# Patient Record
Sex: Female | Born: 1955 | Race: White | Marital: Married | Smoking: Never smoker
Health system: Southern US, Community
[De-identification: ages and names within clinical notes are randomized; demographics above are authoritative.]

## PROBLEM LIST (undated history)

## (undated) DIAGNOSIS — F329 Major depressive disorder, single episode, unspecified: Secondary | ICD-10-CM

## (undated) DIAGNOSIS — K859 Acute pancreatitis without necrosis or infection, unspecified: Secondary | ICD-10-CM

## (undated) DIAGNOSIS — K219 Gastro-esophageal reflux disease without esophagitis: Secondary | ICD-10-CM

## (undated) DIAGNOSIS — F32A Depression, unspecified: Secondary | ICD-10-CM

## (undated) HISTORY — DX: Major depressive disorder, single episode, unspecified: F32.9

## (undated) HISTORY — DX: Gastro-esophageal reflux disease without esophagitis: K21.9

## (undated) HISTORY — DX: Depression, unspecified: F32.A

## (undated) HISTORY — DX: Acute pancreatitis without necrosis or infection, unspecified: K85.90

---

## 1989-05-22 HISTORY — PX: ABDOMINAL HYSTERECTOMY: SHX81

## 1990-05-22 HISTORY — PX: BLADDER SUSPENSION: SHX72

## 1994-05-22 HISTORY — PX: HERNIA REPAIR: SHX51

## 2008-05-22 HISTORY — PX: CHOLECYSTECTOMY: SHX55

## 2011-02-07 ENCOUNTER — Ambulatory Visit: Payer: Self-pay | Admitting: Gynecology

## 2011-02-21 ENCOUNTER — Ambulatory Visit: Payer: Self-pay | Admitting: Gynecology

## 2011-03-08 ENCOUNTER — Ambulatory Visit: Payer: Self-pay | Admitting: Gynecology

## 2011-03-31 ENCOUNTER — Encounter: Payer: Self-pay | Admitting: Anesthesiology

## 2011-03-31 ENCOUNTER — Ambulatory Visit (INDEPENDENT_AMBULATORY_CARE_PROVIDER_SITE_OTHER): Payer: PRIVATE HEALTH INSURANCE | Admitting: Gynecology

## 2011-03-31 ENCOUNTER — Other Ambulatory Visit (HOSPITAL_COMMUNITY)
Admission: RE | Admit: 2011-03-31 | Discharge: 2011-03-31 | Disposition: A | Discharge status: Home / Self Care | Payer: PRIVATE HEALTH INSURANCE | Source: Ambulatory Visit | Attending: Gynecology | Admitting: Gynecology

## 2011-03-31 ENCOUNTER — Ambulatory Visit: Payer: Self-pay | Admitting: Gynecology

## 2011-03-31 VITALS — BP 120/70 | Ht <= 58 in | Wt 107.0 lb

## 2011-03-31 DIAGNOSIS — Z01419 Encounter for gynecological examination (general) (routine) without abnormal findings: Secondary | ICD-10-CM | POA: Insufficient documentation

## 2011-03-31 DIAGNOSIS — Z8719 Personal history of other diseases of the digestive system: Secondary | ICD-10-CM

## 2011-03-31 DIAGNOSIS — N951 Menopausal and female climacteric states: Secondary | ICD-10-CM

## 2011-03-31 DIAGNOSIS — IMO0002 Reserved for concepts with insufficient information to code with codable children: Secondary | ICD-10-CM

## 2011-03-31 DIAGNOSIS — K219 Gastro-esophageal reflux disease without esophagitis: Secondary | ICD-10-CM | POA: Insufficient documentation

## 2011-03-31 DIAGNOSIS — N930 Postcoital and contact bleeding: Secondary | ICD-10-CM

## 2011-03-31 DIAGNOSIS — Z1211 Encounter for screening for malignant neoplasm of colon: Secondary | ICD-10-CM

## 2011-03-31 DIAGNOSIS — N952 Postmenopausal atrophic vaginitis: Secondary | ICD-10-CM

## 2011-03-31 MED ORDER — ESTRADIOL 0.1 MG/GM VA CREA: 2.0000 g | TOPICAL_CREAM | VAGINAL | Status: AC

## 2011-03-31 NOTE — Progress Notes (Signed)
Courtney Dillon [birth year] [id number]   History:    55 y.o.  for annual exam and a new patient to the practice. Patient stated that she suffers from dyspareunia as a result of her vaginal dryness and at one point had some vaginal spotting after intercourse. At one point after intercourse and she went to the bathroom she noted some blood in the toilet water. Her urinalysis today was essentially unremarkable. Patient with prior history transvaginal hysterectomy for uterine prolapse in her late 30s and a few years later she had a retropubic sling for stress urinary incontinence. She stated she's been on oral estrogen for about 17 years. She is currently on para 0.3 mg daily. Her last gynecological examination was 6 years ago. Dr.Keung Nedra Hai is her primary physician [place] and she had her lab drawn in his office last month. Patient with past history of pancreatitis had been followed by the gastroenterologist and [facility] and her last colonoscopy was in 2010. She is overdue for mammogram. Patient never had a baseline bone density study.  Past medical history,surgical history, family history and social history were all reviewed and documented in the EPIC chart.  Gynecologic History Patient's last menstrual period was 03/30/1990. Contraception: none Last Pap: 6 years ago. Results were: normal Last mammogram: Over year ago. Results were: normal  Obstetric History OB History    Grav Para Term Preterm Abortions TAB SAB Ect Mult Living   8 8 8       8      # Outc Date GA Lbr Len/2nd Wgt Sex Del Anes PTL Lv   1 TRM     F CS   Yes   2 TRM     F CS  No Yes   3 TRM     F SVD   Yes   4 TRM     F SVD   Yes   5 TRM     F SVD   Yes   6 TRM     F SVD   Yes   7 TRM     F SVD  No Yes   8 TRM     M SVD  No Yes       ROS:  Was performed and pertinent positives and negatives are included in the history.  Exam: chaperone present  BP 120/70  Ht 4' 9.25" (1.454 m)  Wt 107 lb (48.535 kg)  BMI 22.95  kg/m2  LMP 03/30/1990  Body mass index is 22.95 kg/(m^2).  General appearance : Well developed well nourished female. No acute distress HEENT: Neck supple, trachea midline, no carotid bruits, no thyroidmegaly Lungs: Clear to auscultation, no rhonchi or wheezes, or rib retractions  Heart: Regular rate and rhythm, no murmurs or gallops Breast:Examined in sitting and supine position were symmetrical in appearance, no palpable masses or tenderness,  no skin retraction, no nipple inversion, no nipple discharge, no skin discoloration, no axillary or supraclavicular lymphadenopathy Abdomen: no palpable masses or tenderness, no rebound or guarding Extremities: no edema or skin discoloration or tenderness  Pelvic:  Bartholin, Urethra, Skene Glands: Within normal limits             Vagina: No gross lesions or discharge  Cervix: Absent  Uterus  absent, Adnexa  Without masses or tenderness  Anus and perineum  normal   Rectovaginal  normal sphincter tone without palpated masses or tenderness             Hemoccult done resulting in some  this dictation     Assessment/Plan:  55 y.o. female for annual exam with evident vaginal atrophy which may been contributing to her dyspareunia and spotting. At time of her Pap smear and was noted that her vagina was very dry and friable which was explained her spotting she's had. No gross lesions were seen. Vaginal cuff intact. Pap smear was done. Lower fecal occult blood testing. She'll be scheduled for a baseline bone density study here in our office the next 2 weeks. Requisition was given her to schedule her mammogram and [facility]. She will discontinue the Premarin 0.3 mg. Start Estrace vaginal cream each bedtime for one week then twice a week thereafter. We went to a lengthy discussion of risks benefits and pros and cons of hormone replacement therapy as well as the WHI study. All these issues were discussed with the patient in Spanish. She was instructed  to take calcium and vitamin D for osteoporosis prevention as well as weight bearing exercise 30-45 minutes 3 or 4 times a week. All the above was discussed with the patient Spanish and we'll follow accordingly.    Ok Edwards MD, 3:58 PM 03/31/2011

## 2011-11-09 ENCOUNTER — Ambulatory Visit: Payer: PRIVATE HEALTH INSURANCE | Admitting: Women's Health

## 2013-01-17 ENCOUNTER — Encounter: Payer: Self-pay | Admitting: Gynecology

## 2013-01-17 ENCOUNTER — Ambulatory Visit (INDEPENDENT_AMBULATORY_CARE_PROVIDER_SITE_OTHER): Payer: Commercial Managed Care - PPO | Admitting: Gynecology

## 2013-01-17 VITALS — BP 120/74

## 2013-01-17 DIAGNOSIS — Z01419 Encounter for gynecological examination (general) (routine) without abnormal findings: Secondary | ICD-10-CM

## 2013-01-17 DIAGNOSIS — Z23 Encounter for immunization: Secondary | ICD-10-CM

## 2013-01-17 DIAGNOSIS — Z79890 Hormone replacement therapy (postmenopausal): Secondary | ICD-10-CM | POA: Insufficient documentation

## 2013-01-17 DIAGNOSIS — M810 Age-related osteoporosis without current pathological fracture: Secondary | ICD-10-CM | POA: Insufficient documentation

## 2013-01-17 DIAGNOSIS — N952 Postmenopausal atrophic vaginitis: Secondary | ICD-10-CM | POA: Insufficient documentation

## 2013-01-17 MED ORDER — NONFORMULARY OR COMPOUNDED ITEM: Status: DC

## 2013-01-17 NOTE — Progress Notes (Signed)
Courtney Dillon [birth year] [id number]   History:    57 y.o.  for annual gyn exam who has not been seen in the office in 2 years. When she was seen in the office in 2012 she was complaining of vaginal dryness and dyspareunia and vaginal spotting after intercourse. She had been on oral estrogen consisting of 0.3 mg daily for about 17 years when I saw her for the first time in 2012. I had switched her to Premarin vaginal cream twice a week for vaginal atrophy and she has not been taking it regularly only on a when necessary basis because of cost. Dr.Keung Nedra Hai is her primary physician [place] who has been doing her lab work. She has informed me that this year as a result of a recent bone density study she was started on Fosamax 70 mg q. Weekly for what sounds to me like osteoporosis.Patient with past history of pancreatitis had been followed by the gastroenterologist and [facility] and her last colonoscopy was in 2010. Patient has informed me that her last mammogram was in 2012 at [facility] and was normal we do not have the report. She states she freely does report so present examination. She is not received the Tdap vaccine.    Past medical history,surgical history, family history and social history were all reviewed and documented in the EPIC chart.  Gynecologic History Patient's last menstrual period was 03/30/1990. Contraception: status post hysterectomy Last Pap: 2012. Results were: normal Last mammogram: 2012. Results were: normal  Obstetric History OB History  Gravida Para Term Preterm AB SAB TAB Ectopic Multiple Living  8 8 8       8     # Outcome Date GA Lbr Len/2nd Weight Sex Delivery Anes PTL Lv  8 TRM     M SVD  N Y  7 TRM     F SVD  N Y  6 TRM     F SVD   Y  5 TRM     F SVD   Y  4 TRM     F SVD   Y  3 TRM     F SVD   Y  2 TRM     F CS  N Y  1 TRM     F CS   Y       ROS: A ROS was performed and pertinent positives and negatives are included in the history.   GENERAL: No fevers or chills. HEENT: No change in vision, no earache, sore throat or sinus congestion. NECK: No pain or stiffness. CARDIOVASCULAR: No chest pain or pressure. No palpitations. PULMONARY: No shortness of breath, cough or wheeze. GASTROINTESTINAL: No abdominal pain, nausea, vomiting or diarrhea, melena or bright red blood per rectum. GENITOURINARY: No urinary frequency, urgency, hesitancy or dysuria. MUSCULOSKELETAL: No joint or muscle pain, no back pain, no recent trauma. DERMATOLOGIC: No rash, no itching, no lesions. ENDOCRINE: No polyuria, polydipsia, no heat or cold intolerance. No recent change in weight. HEMATOLOGICAL: No anemia or easy bruising or bleeding. NEUROLOGIC: No headache, seizures, numbness, tingling or weakness. PSYCHIATRIC: No depression, no loss of interest in normal activity or change in sleep pattern.     Exam: chaperone present  BP 120/74  LMP 03/30/1990  There is no weight on file to calculate BMI.  General appearance : Well developed well nourished female. No acute distress HEENT: Neck supple, trachea midline, no carotid bruits, no thyroidmegaly Lungs: Clear to auscultation, no rhonchi or wheezes, or rib  retractions  Heart: Regular rate and rhythm, no murmurs or gallops Breast:Examined in sitting and supine position were symmetrical in appearance, no palpable masses or tenderness,  no skin retraction, no nipple inversion, no nipple discharge, no skin discoloration, no axillary or supraclavicular lymphadenopathy Abdomen: no palpable masses or tenderness, no rebound or guarding Extremities: no edema or skin discoloration or tenderness  Pelvic:  Bartholin, Urethra, Skene Glands: Within normal limits             Vagina: No gross lesions or discharge  Cervix: absent  Uterus Absent  Adnexa  Without masses or tenderness  Anus and perineum  normal   Rectovaginal  normal sphincter tone without palpated masses or tenderness             Hemoccult Card provided      Assessment/Plan:  57 y.o. female for annual exam who is symptomatic as to her vaginal atrophy. No vaginal bleeding reported. We once again discussed women's health initiative study. She will be placed on the compounded estrogen such as estradiol 0.02% to apply intravaginally twice a week. I have requested that she obtain copy of her bone density study for me to review as well as her last mammogram. I have given her a requisition for her to schedule her mammogram at [facility] next few weeks. We discussed the importance of calcium and vitamin D with regular exercise. She was reminded to submit to the office for Hemoccult cards for testing. Patient received the Tdap vaccine today.    Ok Edwards MD, 5:35 PM 01/17/2013

## 2013-01-17 NOTE — Patient Instructions (Addendum)
Vacuna difteria, ttanos, tos ferina (DTP) - Lo que debe saber  (Tetanus, Diphtheria, Pertussis [Tdap] Vaccine, What You Need to Know) PORQU VACUNARSE?  El ttanos, la difteria y la tos [place] pueden ser enfermedades muy graves, an en adolescentes y [place]. La vacuna Tdap nos puede proteger de estas enfermedades.  El TTANOS (Trismo) provoca la contraccin dolorosa de los msculos, por lo general, en todo el cuerpo.   Puede causar el endurecimiento de los msculos de la cabeza y el cuello, de modo que impide abrir la boca, tragar y en algunos casos, Industrial/product designer. El ttanos causa la muerte de 1 de cada 5 personas que se infectan. La DIFTERIA produce la formacin de una membrana gruesa que cubre el fondo de la garganta.   Puede causar problemas respiratorios, parlisis, insuficiencia cardaca e incluso la muerte. TOS FERINA (Pertusis) causa episodios de tos graves, que pueden hacer difcil la respiracin, causar vmitos y trastornos del sueo.   Tambin puede ser la causa de prdida de [place], incontinencia y Surveyor, minerals de Forensic psychologist. Dos de cada 100 adolescentes y Orthoptist de cada 100 adultos que enferman de pertusis deben ser hospitalizados, tienen complicaciones como la neumona o [place]. Estas enfermedades son provocadas por bacterias. La difteria y el pertusis se contagian de persona a persona a travs de la tos o el estornudo. El ttanos ingresa al organismo a travs de cortes, rasguos o heridas.  Antes de las vacunas, en los [place] se vieron ms de 200.000 casos al ao de difteria y tos [place] y cientos de casos de ttanos. Desde el inicio de la vacunacin, los casos de ttanos y difteria han disminuido alrededor del 99% y los casos de tos ferina alrededor del 80%.  Tdap  La vacuna Tdap protege a adolescentes y adultos contra el ttanos, la difteria y la tos [place]. Una dosis de Tdap se administra a los 11 o 12 aos de edad. Las [employer] and [employer] no recibieron la vacuna Tdap a esa edad deben  recibirla tan pronto como sea posible.  Es muy importante que los profesionales de la salud y todos aquellos que tengan contacto cercano con bebs menores de 12 meses reciban la Tdap.  Las mujeres embarazadas deben recibir una dosis de Tdap en cada Psychiatrist, para proteger al recin nacido de la tos [place]. Los nios tienen mayor riesgo de complicaciones graves y potencialmente mortales debido a la tos [place].  Una vacuna similar, llamada Td, protege contra el ttanos y la difteria, pero no contra la tos [place]. Cada 10 aos debe recibirse un refuerzo de Td. La Tdap se puede administrar como uno de estos refuerzos, si todava no ha recibido una dosis. Tambin se puede aplicar despus de un corte o quemadura grave para prevenir la infeccin por ttanos.  El mdico le dar ms informacin.   Osteoporosis (Osteoporosis)  A lo largo de la vida, el cuerpo elimina el tejido viejo de los [place] y lo reemplaza por tejido nuevo. A medida que se envejece, el cuerpo no puede reponerlo tan rpidamente como lo elimina. Alrededor Safeco Corporation 30 aos, la mayora de las personas comienza a perder poco a poco el tejido de los huesos debido al desequilibrio entre la prdida y la reposicin. Algunas personas pierden ms tejido Fisher [employer]. La prdida del tejido del hueso ms all de un grado normal se considera osteoporosis.  La osteoporosis afecta la resistencia y la durabilidad de los [place]. El interior de los extremos de los huesos y los huesos planos, Fiserv  de la pelvis, se parecen a un panal porque estn llenos de pequeos espacios abiertos. Al perder tejido los huesos se vuelven menos densos. Esto significa que los espacios abiertos se hacen ms grandes y las paredes entre estos espacios se vuelven ms delgadas. Como consecuencia, los huesos se vuelven ms dbiles. Los huesos de una persona que sufre osteoporosis llegan a ser tan dbiles que pueden romperse (fractura) en un accidente leve, como una simple cada.   CAUSAS  Los siguientes factores han sido asociados con el desarrollo de la osteoporosis.   El hbito de fumar.  Beber ms de 2 medidas de bebidas [employer]el[employer] Corporation a la [place].  Uso de ciertos medicamentos durante un tiempo prolongado.  Corticoides.  Medicamentos para la quimioterapia.  Medicamentos para la tiroides.  Medicamentos antiepilpticos.  Medicamentos de supresin gonadal.  Medicamentos inmunosupresores.  Tener bajo peso.  Falta de actividad fsica.  Falta de exposicin al sol. Esto puede ser la causa del dficit de vitamina D.  Ciertas enfermedades crnicas.  Ciertas enfermedades inflamatorias del intestino, como la enfermedad de Crohn y la colitis ulcerosa.  Diabetes.  Hipertiroidismo.  Hiperparatiroidismo. FACTORES DE RIESGO  Cualquier persona puede desarrollar osteoporosis. Sin embargo, los siguientes factores pueden aumentar el riesgo de Environmental education officer osteoporosis.   El sexo. La mujeres tiene ms [employer].  La edad. Tener ms de 50 aos aumenta el riesgo.  La etnia. Las personas de raza blanca y asitica tienen ms riesgo.  Antecedentes familiares de osteoporosis. Tener un miembro en la familia que haya sufrido osteoporosis hace que aumente el [place]. SNTOMAS  Generalmente las personas que sufren osteoporosis no tienen sntomas.  DIAGNSTICO  Algunos signos hallados durante un examen fsico que pueden hacer sospechar al mdico que sufre osteoporosis son:   Disminucin de Print production planner. La causa en general es la compresin de los huesos que forman la columna vertebral (vrtebras), que se han debilitado y se han fracturado.  Una curva o redondeo de la espalda (cifosis). Para confirmar los signos de osteoporosis, el mdico puede solicitar un procedimiento que [place] 2 haces de rayos X en dosis bajas, con diferentes niveles de energa para medir la densidad mineral sea (absorciometra de rayos X de energa dual [DXA]). Adems, el  mdico controlar su nivel de vitamina D.  TRATAMIENTO  El objetivo del tratamiento de la osteoporosis es el fortalecimiento de los huesos con el fin de disminuir el riesgo de fracturas. Hay diferentes tipos de medicamentos disponibles para ayudar a Armed forces technical officer. Algunos de estos medicamentos actan reduciendo H[employer]el Foods de prdida sea. Otros medicamentos funcionan aumentando la densidad sea. El tratamiento tambin consiste en asegurarse de que sus niveles de calcio y vitamina D son adecuados.  PREVENCIN  Hay cosas que usted puede hacer para prevenir la osteoporosis. Un consumo adecuado de calcio y vitamina D puede ayudar a Personnel officer una ptima densidad mineral sea. El ejercicio regular tambin puede ayudar, sobre todo la resistencia y actividades de alto impacto. Si usted fuma, dejar de fumar es una parte importante de la prevencin de la osteoporosis.  ASEGRESE DE QUE:   Comprende estas instrucciones.  Controlar su enfermedad.  Solicitar ayuda de inmediato si no mejora o si empeora. Document Released: 02/15/2005 Document Revised: 07/31/2011 [facility] Patient Information 2014 [place], [place]. Alendronate effervescent oral tablets Qu es este medicamento? El ALENDRONATO reduce la prdida de calcio de los [place]. Este medicamento se utiliza para Programmer, multimedia la produccin de Dow Chemical sano y prevenir la prdida de Art gallery manager  con osteoporosis. Este medicamento puede ser utilizado para otros usos; si tiene alguna pregunta consulte con su proveedor de atencin mdica o con su farmacutico. Qu le debo informar a mi profesional de la salud antes de tomar este medicamento? Necesita saber si usted presenta alguno de los siguientes problemas o situaciones: -problemas estomacales, intestinales o esofgicos, tales como reflujo cido o ERGE -enfermedad dental -enfermedad cardiaca -alta presin sangunea  -enfermedad renal -bajo nivel de calcio en la sangre -bajo nivel de vitamina  D -problemas al tragar -problemas de estar sentado o de pie durante 30 minutos -una reaccin alrgica o inusual al alendronato, a otros medicamentos, alimentos, colorantes o conservantes -si est embarazada o buscando quedar embarazada -si est amamantando a un beb  2013, Elsevier/Gold Standard. (03/24/2011 2:11:23 PM)  La Tdap puede administrarse de manera segura simultneamente con otras vacunas.  ALGUNAS PERSONAS NO DEBEN RECIBIR ESTA VACUNA.   Si alguna vez tuvo una reaccin alrgica potencialmente mortal despus de Neomia Dear dosis de la vacuna contra el ttanos, la diferia o la tos [place], o tuvo una alergia grave a cualquiera de los componentes de esta vacuna, no debe aplicarse la vacuna. Informe a su mdico si usted sufre algn tipo de alergia grave.  Si estuvo en coma o sufri mltiples convulsiones dentro de los [address] posteriores despus de una dosis de DTP o DTaP no debe recibir la Tdap, salvo que se encuentre otra causa En este caso puede recibir la Td.  Consulte con su mdico si:  tiene epilepsia u otra enfermedad del sistema nervioso,  siente dolor intenso o se hincha despus de recibir cualquier vacuna contra la difteria, el ttanos o la tos [place],  alguna vez ha sufrido el sndrome de Pension scheme manager,  no se siente Research scientist (life sciences) en que se ha programado la vacuna. RIESGOS DE UNA REACCIN A LA VACUNA Con cualquier medicamento, incluyendo las vacunas, existe la posibilidad de que aparezcan efectos secundarios. Estos son leves y desaparecen por s solos, pero tambin son posibles las reacciones graves.  Breves episodios de Cablevision Systems seguir a una vacunacin, causando lesiones por la cada. Sentarse o recostarse durante 15 minutos puede ayudar a International aid/development worker. Informe al mdico si se siente mareado o aturdido, tiene Allied Waste Industries visin o zumbidos en los odos.  Problemas leves luego de la Tdap (no interferirn con las actividades)   Dolor en el sitio de la inyeccin (alrededor de 1  de cada 4 adolescentes o 2 de cada 3 adultos).  Enrojecimiento o hinchazn en el lugar de la inyeccin (1 de cada 5 personas).  Fiebre leve de al menos 100,4 F (38 C) (hasta alrededor de 1 cada 25 adolescentes y 1 de cada 100 adultos).  Dolor de cabeza (3 o 4de cada 10 personas).  Cansancio (1 de cada 3 o 4 personas).  Nuseas, vmitos, diarrea, dolor de estmago (1 de cada 4 adolescentes o 1 de cada 10 adultos).  Escalofros, dolores corporales, dolor articular, erupciones, inflamacin de las glndulas (poco frecuente). Problemas moderados: (interfieren con las actividades, pero no requieren atencin mdica)   Art therapist de la inyeccin (1 de cada 5 adolescentes o 1 de cada 100 adultos).  Enrojecimiento o inflamacin (1 de cada 16 adolescentes y 1 de cada 25 adultos).  Fiebre de ms de 102F o 38,9C (1 de cada 100 adolescentes o 1 de cada 250 adultos).  Dolor de cabeza (alrededor de 4 de cada 20 adolescentes y 3 de cada 10 adultos).  Nuseas, vmitos, diarrea, dolor  de estmago (1 a 3 de cada 100 personas).  Hinchazn de todo el brazo en el que se aplic la vacuna (3 de cada100 personas). Problemas graves: luego de la Tdap (no puede Education officer, environmental las actividades habituales, requiere atencin mdica)   Inflamacin, dolor intenso, sangrado y enrojecimiento en el brazo, en el sitio de la inyeccin (poco frecuente). Una reaccin alrgica grave puede ocurrir despus de la administracin de cualquier vacuna (se estima en menos de 1 en un milln de dosis).  QU PASA SI HAY UNA REACCIN GRAVE?  Qu signos debo buscar?  Observe todo lo que le preocupe, como signos de una reaccin alrgica grave, fiebre muy alta o cambios en el comportamiento. Los signos de Runner, broadcasting/film/video grave pueden incluir urticaria, hinchazn de la cara y la garganta, dificultad para respirar, ritmo cardaco acelerado, mareos y debilidad. Estos sntomas pueden comenzar entre unos pocos minutos y algunas  horas despus de la vacunacin.  Qu debo hacer?  Si usted piensa que se trata de una reaccin alrgica grave o de otra emergencia que no puede esperar, llame al 911 o lleve a la persona al hospital ms cercano. De lo contrario, llame a su mdico.  Despus, la reaccin debe informarse a la "Vaccine Adverse Event Reporting System" (Sistema de informacin sobre efectos adversos de las vacunas -VAERS). El mdico o usted mismo pueden Sales executive informe en el sitio web del VAERS www.vaers.RepJet.co.nz llame al [phone]. El VAERS es slo para Biomedical engineer. No brindan consejo mdico.  PROGRAMA NACIONAL DE COMPENSACIN DE DAOS POR VACUNAS  El National Vaccine Injury Compensation Program (VICP) es un programa federal que fue creado para compensar a las personas que puedan haber sufrido daos al recibir ciertas vacunas.  [place]quellas personas que consideren que han sufrido un dao como consecuencia de una vacuna y quieren saber ms acerca del programa y como presentar Roslynn Amble, pueden llamar [phone] o visite el sitio web del VICP en SpiritualWord.[email or web address].  CMO PUEDO OBTENER MS INFORMACIN?   Consulte a su mdico.  Comunquese con el servicio de salud de su localidad o [address].  Comunquese con los Centros para el control y la prevencin de Child psychotherapist for Disease Control and Prevention , CDC).  llamando al [phone] o visitando el sitio web del CDC en PicCapture.uy. CDC Tdap Vaccine VIS (09/28/11)  Document Released: 04/24/2012 [facility] Patient Information 2014 [place], [place].

## 2013-01-30 ENCOUNTER — Encounter: Payer: Self-pay | Admitting: Gynecology

## 2013-02-07 ENCOUNTER — Other Ambulatory Visit: Payer: Commercial Managed Care - PPO | Admitting: Anesthesiology

## 2013-02-07 DIAGNOSIS — Z1211 Encounter for screening for malignant neoplasm of colon: Secondary | ICD-10-CM

## 2013-02-25 ENCOUNTER — Ambulatory Visit: Payer: Commercial Managed Care - PPO | Admitting: Gynecology

## 2013-03-21 ENCOUNTER — Ambulatory Visit (INDEPENDENT_AMBULATORY_CARE_PROVIDER_SITE_OTHER): Payer: Commercial Managed Care - PPO | Admitting: Gynecology

## 2013-03-21 VITALS — BP 130/76

## 2013-03-21 DIAGNOSIS — M899 Disorder of bone, unspecified: Secondary | ICD-10-CM

## 2013-03-21 DIAGNOSIS — M8580 Other specified disorders of bone density and structure, unspecified site: Secondary | ICD-10-CM | POA: Insufficient documentation

## 2013-03-21 NOTE — Progress Notes (Signed)
Patient is a 57-year-old presented to the office today to discuss her recent bone density study as well as her mammogram. She was seen in the office for her annual exam here in our office on August 29. See previous note for detail. Dr.Keung Nedra Hai is her primary physician [place] had put her on last month on Fosamax 70 mg q. Weekly as a result of her recent bone density study. Patient wanted a second opinion brought her bone density study report for me to analyze and discussed with her. Her recent mammogram was normal also he had done blood work on her which patient brought with her which included a normal CBC, a normal lipid panel, normal TSH, comprehensive metabolic panel was normal with only slight low serum creatinine with a value of 0.52.  Patient is currently taking vitamin D 2000 units daily and has been taking TUMS occasionally. Review of her bone density study done at [facility] whereby they compare 2004 weight 2014 and a documented that there was significant change in her bone density whereby the AP spine had a statistically significant decrease of -13% with a score -1.8. Her right femoral neck T. Score was -2.3 with a statistically significant decrease of -15% from previous study. Interestingly they did do a FRAX study but values were sucked threshold. Having taking into account that patient weighs less than 126 pounds has history of rheumatoid arthritis and Caucasian she would benefit from antiresorptive agents such as Fosamax 70 mg weekly along with calcium 1200 mg daily with vitamin D 2000 units daily with weight bearing exercises 45 minutes 3 times a week. The risks benefits and pros and cons of this medication were discussed with her to include osteonecrosis of the jaw and subtrochanteric fractures. All this was explained to the patient's pannus as well as literature formation was provided. We will repeat peak her bone density study in one year to monitor response to treatment.

## 2013-03-24 ENCOUNTER — Encounter: Payer: Self-pay | Admitting: Internal Medicine

## 2013-04-21 ENCOUNTER — Other Ambulatory Visit (INDEPENDENT_AMBULATORY_CARE_PROVIDER_SITE_OTHER): Payer: Commercial Managed Care - PPO

## 2013-04-21 ENCOUNTER — Ambulatory Visit: Payer: PRIVATE HEALTH INSURANCE | Admitting: Internal Medicine

## 2013-04-21 ENCOUNTER — Encounter: Payer: Self-pay | Admitting: Gastroenterology

## 2013-04-21 ENCOUNTER — Ambulatory Visit (INDEPENDENT_AMBULATORY_CARE_PROVIDER_SITE_OTHER): Payer: Commercial Managed Care - PPO | Admitting: Gastroenterology

## 2013-04-21 VITALS — BP 128/64 | HR 88 | Ht <= 58 in | Wt 102.0 lb

## 2013-04-21 DIAGNOSIS — R110 Nausea: Secondary | ICD-10-CM

## 2013-04-21 DIAGNOSIS — R1013 Epigastric pain: Secondary | ICD-10-CM

## 2013-04-21 DIAGNOSIS — R6881 Early satiety: Secondary | ICD-10-CM

## 2013-04-21 LAB — CBC WITH DIFFERENTIAL/PLATELET
Eosinophils Absolute: 0.1 10*3/uL (ref 0.0–0.7)
Eosinophils Relative: 1.7 % (ref 0.0–5.0)
HCT: 38.6 % (ref 36.0–46.0)
Hemoglobin: 12.9 g/dL (ref 12.0–15.0)
Lymphocytes Relative: 27.8 % (ref 12.0–46.0)
Lymphs Abs: 1.6 10*3/uL (ref 0.7–4.0)
MCV: 95.2 fl (ref 78.0–100.0)
Monocytes Relative: 6.1 % (ref 3.0–12.0)
Neutro Abs: 3.6 10*3/uL (ref 1.4–7.7)
Platelets: 312 10*3/uL (ref 150.0–400.0)
RBC: 4.06 Mil/uL (ref 3.87–5.11)
RDW: 12.1 % (ref 11.5–14.6)
WBC: 5.6 10*3/uL (ref 4.5–10.5)

## 2013-04-21 LAB — COMPREHENSIVE METABOLIC PANEL
Alkaline Phosphatase: 51 U/L (ref 39–117)
BUN: 10 mg/dL (ref 6–23)
CO2: 28 mEq/L (ref 19–32)
Calcium: 8.8 mg/dL (ref 8.4–10.5)
Chloride: 105 mEq/L (ref 96–112)
Creatinine, Ser: 0.5 mg/dL (ref 0.4–1.2)
GFR: 132.47 mL/min (ref >60.00)
Glucose, Bld: 82 mg/dL (ref 70–99)
Sodium: 141 mEq/L (ref 135–145)
Total Bilirubin: 0.5 mg/dL (ref 0.3–1.2)
Total Protein: 7.2 g/dL (ref 6.0–8.3)

## 2013-04-21 LAB — AMYLASE: Amylase: 70 U/L (ref 27–131)

## 2013-04-21 MED ORDER — LANSOPRAZOLE 30 MG PO CPDR: 30.0000 mg | DELAYED_RELEASE_CAPSULE | Freq: Two times a day (BID) | ORAL | Status: AC

## 2013-04-21 NOTE — Progress Notes (Signed)
04/21/2013 Courtney Dillon [id number] December 24, 1955   HISTORY OF PRESENT ILLNESS:  This is a pleasant 57 year old Spanish-speaking female who presents to our office today with her daughter who provided translation. She has been referred to our office for evaluation of epigastric abdominal pain, nausea, and early satiety. She states that after she eats she gets discomfort in her epigastrium and feels very full quickly even after eating small amounts. Then she has nausea, but no vomiting. She does have a history of gallstone pancreatitis in 2010 by her report and had her gallbladder out at that time. She had a colonoscopy and EGD in 2010 as well in [place] by Dr. Chales Abrahams and we are working on obtaining those records. She thinks her colonoscopy was normal and is not sure what was found on the endoscopy but she was placed on Prevacid 30 mg daily. She takes that each morning along with a dicyclomine 10 mg, which she was given around the same time.  Denies any complaints of dysphagia.    Past Medical History  Diagnosis Date  . Depression   . Acid reflux   . Pancreatitis    Past Surgical History  Procedure Laterality Date  . Bladder suspension  1992  . Abdominal hysterectomy  1991    HYSTERECTOMY WITH OVARIAN PRESERVATION  . Hernia repair  1996  . Cholecystectomy  2010    reports that she has never smoked. She has never used smokeless tobacco. She reports that she does not drink alcohol or use illicit drugs. family history is not on file. No Known Allergies    Outpatient Encounter Prescriptions as of 04/21/2013  Medication Sig  . alendronate (FOSAMAX) 70 MG tablet Take 70 mg by mouth every 7 (seven) days. Take with a full glass of water on an empty stomach.  Marland Kitchen aspirin 81 MG tablet Take 81 mg by mouth daily.  Marland Kitchen buPROPion (WELLBUTRIN XL) 300 MG 24 hr tablet Take 300 mg by mouth daily.    Marland Kitchen dicyclomine (BENTYL) 10 MG capsule Take 10 mg by mouth 4 (four) times daily -  before meals and at bedtime.    .  lansoprazole (PREVACID) 30 MG capsule Take 30 mg by mouth daily.    . NONFORMULARY OR COMPOUNDED ITEM Estradiol .02% 1 ML Prefilled Applicator Sig: apply vaginally twice a week #90 Day Supply with 4 refills     REVIEW OF SYSTEMS  : All other systems reviewed and negative except where noted in the History of Present Illness.   PHYSICAL EXAM: BP 128/64  Pulse 88  Ht 4' 9.25" (1.454 m)  Wt 102 lb (46.267 kg)  BMI 21.88 kg/m2  LMP 03/30/1990 General: Well developed female in no acute distress Head: Normocephalic and atraumatic Eyes:  Sclerae anicteric, conjunctiva pink. Ears: Normal auditory acuity. Lungs: Clear throughout to auscultation Heart: Regular rate and rhythm Abdomen: Soft, non-distended.  BS present.  Mild epigastric TTP without R/R/G. Musculoskeletal: Symmetrical with no gross deformities.  Skin: No lesions on visible extremities Extremities: No edema  Neurological: Alert oriented x 4, grossly non-focal Psychological:  Alert and cooperative. Normal mood and affect  ASSESSMENT AND PLAN: -Epigastric abdominal pain with nausea and early satiety:  Apparently she has a history of pancreatitis in 2010 due to gallbladder issues and had a cholecystectomy after. May have H. pylori gastritis or gastroparesis. Will check general labs including CBC, CMP, amylase, and lipase. We will schedule for EGD for evaluation. We are working on getting her records from Dr. Chales Abrahams for previous colonoscopy, etc.  She will increase her Prevacid to twice daily in the interim. May need to consider gastric emptying scan pending results of the EGD.

## 2013-04-21 NOTE — Progress Notes (Signed)
Agree with initial assessment and plans 

## 2013-04-21 NOTE — Patient Instructions (Addendum)
You have been scheduled for an endoscopy with propofol. Please follow written instructions given to you at your visit today. If you use inhalers (even only as needed), please bring them with you on the day of your procedure. Your physician has requested that you go to www.startemmi.com and enter the access code given to you at your visit today. This web site gives a general overview about your procedure. However, you should still follow specific instructions given to you by our office regarding your preparation for the procedure.  Your physician has requested that you go to the basement for the following lab work before leaving today: CBC, CMET, Amylase, Lipase  We have requested records from Dr Urban Gibson office.  Please increase your Prevacid to twice daily dosing. We have sent a new prescription to your pharmacy.  CC:Dr Nedra Hai

## 2013-04-22 ENCOUNTER — Encounter: Payer: Self-pay | Admitting: Internal Medicine

## 2013-04-24 ENCOUNTER — Encounter: Payer: Self-pay | Admitting: Internal Medicine

## 2013-04-24 ENCOUNTER — Telehealth: Payer: Self-pay

## 2013-04-24 ENCOUNTER — Other Ambulatory Visit: Payer: Self-pay

## 2013-04-24 ENCOUNTER — Ambulatory Visit (AMBULATORY_SURGERY_CENTER): Payer: Commercial Managed Care - PPO | Admitting: Internal Medicine

## 2013-04-24 VITALS — BP 117/77 | HR 87 | Temp 97.0°F | Resp 31 | Ht <= 58 in | Wt 102.0 lb

## 2013-04-24 DIAGNOSIS — R1013 Epigastric pain: Secondary | ICD-10-CM

## 2013-04-24 DIAGNOSIS — R109 Unspecified abdominal pain: Secondary | ICD-10-CM

## 2013-04-24 MED ORDER — SODIUM CHLORIDE 0.9 % IV SOLN: 500.0000 mL | INTRAVENOUS | Status: DC

## 2013-04-24 NOTE — Op Note (Signed)
[facility] [address] [place], [address]   ENDOSCOPY PROCEDURE REPORT  PATIENT: Courtney, Dillon  MR#: [id number] BIRTHDATE: [birth year] , 57  yrs. old GENDER: Female ENDOSCOPIST: Roxy Cedar, MD REFERRED BY:  Simone Curia, M.D. PROCEDURE DATE:  04/24/2013 PROCEDURE:  EGD, diagnostic ASA CLASS:     Class II INDICATIONS:  abdominal pain.   Nausea.  Early satiety MEDICATIONS: MAC sedation, administered by CRNA and propofol (Diprivan) 150mg  IV TOPICAL ANESTHETIC: Cetacaine Spray  DESCRIPTION OF PROCEDURE: After the risks benefits and alternatives of the procedure were thoroughly explained, informed consent was obtained.  The [id number] endoscope was introduced through the mouth and advanced to the second portion of the duodenum. Without limitations.  The instrument was slowly withdrawn as the mucosa was fully examined.      EXAM:The upper, middle and distal third of the esophagus were carefully inspected and no abnormalities were noted.  The z-line was well seen at the GEJ.  The endoscope was pushed into the fundus which was normal including a retroflexed view.  The antrum, gastric body, first and second part of the duodenum were unremarkable. Retroflexed views revealed no abnormalities.     The scope was then withdrawn from the patient and the procedure completed.  COMPLICATIONS: There were no complications.  ENDOSCOPIC IMPRESSION: 1.Normal EGD  RECOMMENDATIONS: 1.  Continue current medications 2.  CT Scan of the abdomen and pelvis with contrast "abdominal pain, early satiety". Our office will contact you to schedule the examination  REPEAT EXAM:  eSigned:  Roxy Cedar, MD 04/24/2013 10:57 AM   BM:WUXLK Nedra Hai, MD and The Patient

## 2013-04-24 NOTE — Progress Notes (Signed)
Daughter, Delton Coombes, interpreting for pt.  CT scan scheduled for December 10 at 9:30.  Gave pt. Contrast with instruction for taking by Darlyn Read RN. Patient did not experience any of the following events: a burn prior to discharge; a fall within the facility; wrong site/side/patient/procedure/implant event; or a hospital transfer or hospital admission upon discharge from the facility. (G8907)Patient did not have preoperative order for IV antibiotic SSI prophylaxis. [phone])

## 2013-04-24 NOTE — Patient Instructions (Signed)

## 2013-04-24 NOTE — Progress Notes (Deleted)
Daughter, Delton Coombes, for interpreter.  Gave contrast with instructions for

## 2013-04-24 NOTE — Telephone Encounter (Signed)
Pt scheduled for CT scan of A/P at [facility] 04/30/13. Pt to be NPO after 5:30am, drink bottle one of contrast at 7:30am, bottle two of contrast at 8:30am. Merlene Laughter RN in [facility] to notify pt of appt date and time.

## 2013-04-25 ENCOUNTER — Telehealth: Payer: Self-pay

## 2013-04-25 NOTE — Telephone Encounter (Signed)
  Follow up Call-  Call back number 04/24/2013  Post procedure Call Back phone  # [phone]  Permission to leave phone message Yes     Patient questions:  Do you have a fever, pain , or abdominal swelling? no Pain Score  0 *  Have you tolerated food without any problems? yes  Have you been able to return to your normal activities? yes  Do you have any questions about your discharge instructions: Diet   no Medications  no Follow up visit  no  Do you have questions or concerns about your Care? no  Actions: * If pain score is 4 or above: No action needed, pain <4.  I spoke with Lavone Orn, the pt's daughter.  She said her mother was to call her this am if any problems and she has not heard from her.  I asked to to please let her mother know I called and to call us back if any problems. Maw

## 2013-04-30 ENCOUNTER — Ambulatory Visit (INDEPENDENT_AMBULATORY_CARE_PROVIDER_SITE_OTHER)
Admission: RE | Admit: 2013-04-30 | Discharge: 2013-04-30 | Disposition: A | Discharge status: Home / Self Care | Payer: Commercial Managed Care - PPO | Source: Ambulatory Visit | Attending: Internal Medicine | Admitting: Internal Medicine

## 2013-04-30 DIAGNOSIS — R109 Unspecified abdominal pain: Secondary | ICD-10-CM

## 2013-04-30 MED ORDER — IOHEXOL 300 MG/ML  SOLN
100.0000 mL | Freq: Once | INTRAMUSCULAR | Status: AC | PRN
  Administered 2013-04-30: 100 mL via INTRAVENOUS

## 2014-02-23 ENCOUNTER — Encounter: Payer: Self-pay | Admitting: Gynecology

## 2014-02-23 ENCOUNTER — Ambulatory Visit (INDEPENDENT_AMBULATORY_CARE_PROVIDER_SITE_OTHER): Payer: Commercial Managed Care - PPO | Admitting: Gynecology

## 2014-02-23 ENCOUNTER — Other Ambulatory Visit (HOSPITAL_COMMUNITY)
Admission: RE | Admit: 2014-02-23 | Discharge: 2014-02-23 | Disposition: A | Discharge status: Home / Self Care | Payer: Commercial Managed Care - PPO | Source: Ambulatory Visit | Attending: Gynecology | Admitting: Gynecology

## 2014-02-23 VITALS — BP 120/88 | Ht <= 58 in | Wt 102.0 lb

## 2014-02-23 DIAGNOSIS — Z01419 Encounter for gynecological examination (general) (routine) without abnormal findings: Secondary | ICD-10-CM

## 2014-02-23 DIAGNOSIS — Z1151 Encounter for screening for human papillomavirus (HPV): Secondary | ICD-10-CM | POA: Insufficient documentation

## 2014-02-23 DIAGNOSIS — Z1272 Encounter for screening for malignant neoplasm of vagina: Secondary | ICD-10-CM

## 2014-02-23 DIAGNOSIS — M8580 Other specified disorders of bone density and structure, unspecified site: Secondary | ICD-10-CM

## 2014-02-23 DIAGNOSIS — Z79890 Hormone replacement therapy (postmenopausal): Secondary | ICD-10-CM

## 2014-02-23 DIAGNOSIS — Z23 Encounter for immunization: Secondary | ICD-10-CM

## 2014-02-23 DIAGNOSIS — N952 Postmenopausal atrophic vaginitis: Secondary | ICD-10-CM

## 2014-02-23 DIAGNOSIS — Z1159 Encounter for screening for other viral diseases: Secondary | ICD-10-CM

## 2014-02-23 LAB — URINALYSIS W MICROSCOPIC + REFLEX CULTURE
Bilirubin Urine: NEGATIVE
Glucose, UA: NEGATIVE mg/dL
HGB URINE DIPSTICK: NEGATIVE
Ketones, ur: NEGATIVE mg/dL
LEUKOCYTES UA: NEGATIVE
NITRITE: NEGATIVE
Protein, ur: NEGATIVE mg/dL
Specific Gravity, Urine: 1.015 (ref 1.005–1.030)
UROBILINOGEN UA: 0.2 mg/dL (ref 0.0–1.0)
pH: 6 (ref 5.0–8.0)

## 2014-02-23 MED ORDER — ALENDRONATE SODIUM 70 MG PO TABS: 70.0000 mg | ORAL_TABLET | ORAL | Status: DC

## 2014-02-23 MED ORDER — NONFORMULARY OR COMPOUNDED ITEM: Status: DC

## 2014-02-23 NOTE — Progress Notes (Signed)
Courtney HumblesLorenza Dillon 09/10/1955 [id number]   History:    58 y.o.  for annual gyn exam with no complaints today. Patient was seen in the office in October 2014 for consultation as a result of her bone density study. [place] started her on Fosamax 70 mg weekly. Patient had come to the office for a second opinion for me to look over her bone density report. She is currently taking vitamin D 2000 units daily and taking times occasionally.  Review of her [facility] density [facility] done [facility] whereby they compare 2004 weight 2014 and a documented that there was significant change in her bone density whereby the AP spine had a statistically significant decrease of -13% with a score -1.8. Her right femoral neck T. Score was -2.3 with a statistically significant decrease of -15% from previous study. Interestingly they did do a FRAX study but values were sub- threshold.  Since she weighs less than 126 pounds along with a history of rheumatoid arthritis and Caucasian we discussed that she would be a candidate and would benefit antiresorptive agents such as Fosamax 70 mg weekly that she was started on. The patient's had no side effects and she started. Patient reports normal colonoscopy 2010. Patient using vaginal estrogen twice a week for vaginal atrophy has done well.  Patient requesting flu vaccine today. Her PCP Dr. Nedra HaiLee has been doing her blood work. Patient has had no past history of abnormal Pap smears. Past medical history,surgical history, family history and social history were all reviewed and documented in the EPIC chart.  Gynecologic History Patient's last menstrual period was 03/30/1990. Contraception: status post hysterectomy Last Pap: 2012. Results were: normal Last mammogram: 2014. Results were: normal  Obstetric History OB History  Gravida Para Term Preterm AB SAB TAB Ectopic Multiple Living  8 8 8       8     # Outcome Date GA Lbr Len/2nd Weight Sex  Delivery Anes PTL Lv  8 TRM     M SVD  N Y  7 TRM     F SVD  N Y  6 TRM     F SVD   Y  5 TRM     F SVD   Y  4 TRM     F SVD   Y  3 TRM     F SVD   Y  2 TRM     F CS  N Y  1 TRM     F CS   Y       ROS: A ROS was performed and pertinent positives and negatives are included in the history.  GENERAL: No fevers or chills. HEENT: No change in vision, no earache, sore throat or sinus congestion. NECK: No pain or stiffness. CARDIOVASCULAR: No chest pain or pressure. No palpitations. PULMONARY: No shortness of breath, cough or wheeze. GASTROINTESTINAL: No abdominal pain, nausea, vomiting or diarrhea, melena or bright red blood per rectum. GENITOURINARY: No urinary frequency, urgency, hesitancy or dysuria. MUSCULOSKELETAL: No joint or muscle pain, no back pain, no recent trauma. DERMATOLOGIC: No rash, no itching, no lesions. ENDOCRINE: No polyuria, polydipsia, no heat or cold intolerance. No recent change in weight. HEMATOLOGICAL: No anemia or easy bruising or bleeding. NEUROLOGIC: No headache, seizures, numbness, tingling or weakness. PSYCHIATRIC: No depression, no loss of interest in normal activity or change in sleep pattern.     Exam: chaperone present  BP 120/88  Ht 4\' 9"  (  1.448 m)  Wt 102 lb (46.267 kg)  BMI 22.07 kg/m2  LMP 03/30/1990  Body mass index is 22.07 kg/(m^2).  General appearance : Well developed well nourished female. No acute distress HEENT: Neck supple, trachea midline, no carotid bruits, no thyroidmegaly Lungs: Clear to auscultation, no rhonchi or wheezes, or rib retractions  Heart: Regular rate and rhythm, no murmurs or gallops Breast:Examined in sitting and supine position were symmetrical in appearance, no palpable masses or tenderness,  no skin retraction, no nipple inversion, no nipple discharge, no skin discoloration, no axillary or supraclavicular lymphadenopathy Abdomen: no palpable masses or tenderness, no rebound or guarding Extremities: no edema or skin  discoloration or tenderness  Pelvic:  Bartholin, Urethra, Skene Glands: Within normal limits             Vagina: No gross lesions or discharge, vaginal atrophy  Cervix: Absent  Uterus  absent  Adnexa  Without masses or tenderness  Anus and perineum  normal   Rectovaginal  normal sphincter tone without palpated masses or tenderness             Hemoccult cards provided     Assessment/Plan:  58 y.o. female for annual exam Pap smear was done today. We discussed that after this Pap smear she will no longer needs Pap smear in accordance to the new. Patient received the flu vaccine today. She was reminded to submit to the office of fecal Hemoccult cards for testing. We discussed importance of monthly breast exams. We discussed importance of calcium vitamin D and regular exercise for osteoporosis prevention. Next year we will followup with a bone density study to analyze response to the Fosamax.  New CDC guidelines is recommending patients be tested once in her lifetime for hepatitis C antibody who were born between [birth year] through 1964. This was discussed with the patient today and has agreed to be tested today.  Ok Edwards MD, 5:37 PM 02/23/2014

## 2014-02-23 NOTE — Patient Instructions (Signed)
Vacuna contra la culebrilla, Lo que debe saber (Shingles Vaccine, What You Need to Know) QU ES LA CULEBRILLA?  Es una erupcin dolorosa de la piel, en la que aparecen ampollas. Esta enfermedad tambin se denomina herpes zoster o zoster.  Se trata de una erupcin que aparece en un lado del rostro o del cuerpo y dura entre 2 y 4 semanas. El sntoma principal es el dolor, que puede ser bastante intenso. Otros sntomas son fiebre, cefalea, escalofros y malestar en el estmago. En casos raros, esta infeccin puede causar neumona, problemas auditivos, ceguera, inflamacin cerebral (encefalitis) o la muerte.  En 1 de cada 5 personas, el dolor intenso puede persistir an despus que la erupcin desaparezca. Este dolor se denomina neurlagia post herptica.  La causa de la culebrilla es el virus varicela-zoster. Este es el mismo virus que causa la varicela. Slo las personas que han sufrido varicela o que han recibido la vacuna contra la varicela pueden tener culebrilla. El virus permanece en el organismo. Puede volver a aparecer muchos aos despus para causar culebrilla.  La culebrilla no se contagia. Sin embargo, una persona que nunca sufri varicela (ni recibi la vacuna) podra contraer varicela contagindose de alguien que padece culebrilla. Esto no es frecuente.  La culebrilla es ms frecuente entre las personas mayores de 50 aos que entre los ms jvenes. Tambin es ms frecuente en personas cuyo sistema inmunolgico est debilitado debido a una enfermedad como el cncer o medicamentos como los corticoides o las drogas utilizadas en quimioterapia.  Al menos 1 milln de personas contrae culebrilla cada ao en los [place]. VACUNA CONTRA LA CULEBRILLA  Esta vacuna fue patentada en 2006. En los ensayos clnicos, se demostr que la vacuna prevena la culebrilla en alrededor de la mitad de las personas. Tambin reduce el dolor asociado a este trastorno.  Se indica una nica dosis de vacuna  en adultos de ms de 60 aos. ALGUNAS PERSONAS NO DEBEN RECIBIR LA VACUNA O DEBEN ESPERAR No deben vacunarse las personas que:  Alguna vez hayan sufrido una reaccin alrgica a la gelatina, al antibitico neomicina o a cualquier otro componente de la vacuna. Si observa una reaccin alrgica grave, comunquese con el mdico.  Su sistema inmunolgico est debilitado debido a:  SIDA u otra enfermedad que afecte el sistema inmunolgico.  Tratamientos con drogas que afecten el sistema inmunolgico como los corticoides.  Tratamientos para el cncer como la radiacin o la quimioterapia.  Tienen una historia de cncer que haya afectado la mdula sea o el sistema linftico, como leucemia o linfoma.  Estn embarazadas o desean estarlo. Las que deseen quedar embarazadas no deben hacerlo hasta al menos 4 semanas luego de recibir esta vacuna. Las personas con enfermedades menores como un resfro, pueden vacunarse. Las personas que sufren enfermedades moderadas o graves deben esperar hasta su recuperacin antes de recibir la vacuna. Aqu se incluye a quienes presenten una temperatura de 101.3 F (38.5 C). CULES SON LOS RIESGOS DE LA VACUNA CONTRA LA CULEBRILLA?  Una vacuna, como cualquier medicamento, puede causar problemas serios, como por ejemplo reacciones alrgicas graves. Sin embargo, el riesgo de que cualquier vacuna cause un dao grave, o la muerte, es extremadamente pequeo.  No se han asociado reacciones de importancia a estas vacunas. Problemas leves  Enrojecimiento, dolor, hinchazn, o picazn en el lugar de la inyeccin (en 1 de cada 3 personas).  Cefalea (en aproximadamente 1 de cada 70 personas). Como todas las vacunas, esta vacuna sigue siendo controlada para hallar problemas   infrecuentes o graves. QU DEBO HACER EN CASO DE UNA REACCIN MODERADA O GRAVE? Qu debo observar? Cualquier estado anormal como una reaccin alrgica grave o fiebre alta. Si ocurre una reaccin alrgica  grave, ocurrir Government social research officerentre unos pocos minutos hasta una hora despus de la aplicacin de la inyeccin. Signos de reaccin alrgica grave que presente dificultad para respirar, debilidad, ronquera o silbidos, ritmo cardaco acelerado, ronchas, mareos, palidez, o hinchazn de la garganta.  Qu debo hacer?  Comunquese con el profesional que lo asiste o lleve inmediatamente a la persona al mdico.  Cuntele al mdico lo que ha sucedido, la fecha y momento en el que ocurri y cundo se aplic la vacuna.  Pdale a su mdico, enfermera o el departamento de salud que informen la reaccin llenando el formulario de Vaccine Adverse Event Report (Informe de reacciones adversas a las vacunas - VAERS). O, puede enviar este informe a travs de la pgina web del VAERS al http://www.vaers.LAgents.nohhs.gov o llamando al ([phone]) [phone]. El VAERS no proporciona orientacin mdica. CMO PUEDO SABER MS?  El profesional podr darle el prospecto de la vacuna o sugerirle otras fuentes de informacin.  Comunquese con los Marine scientistCenters for Micron TechnologyDisease Control and Prevention (Centros para el control y la prevencin de enfermedades, CDC).  Llame al [phone])573-7996 (1-800-CDC-INFO).  Visite los sitios web de Energy Transfer PartnersCDC ubicados https://jennings.com/enhttp://www.cdc.gov/vaccines. CDC Shingles Vaccine-Spanish VIS (02/25/08) Document Released: 10/25/2007 Document Revised: 07/31/2011 ExitCare Patient Information 2015 [place], [place]. This information is not intended to replace advice given to you by your health care provider. Make sure you discuss any questions you have with your health care provider. Influenza Virus Vaccine injection (Fluarix) Qu es este medicamento? La VACUNA ANTIGRIPAL ayuda a disminuir el riesgo de contraer la influenza, tambin conocida como la gripe. La vacuna solo ayuda a protegerle contra algunas cepas de influenza. Esta vacuna no ayuda a reducir Nurse, adultel riesgo de contraer influenza pandmica H1N1. Este medicamento puede ser utilizado para otros usos;  si tiene alguna pregunta consulte con su proveedor de atencin mdica o con su farmacutico. MARCAS COMERCIALES DISPONIBLES: Fluarix, Fluzone Qu le debo informar a mi profesional de la salud antes de tomar este medicamento? Necesita saber si usted presenta alguno de los siguientes problemas o situaciones: -trastorno de sangrado como hemofilia -fiebre o infeccin -sndrome de Guillain-Barre u otros problemas neurolgicos -problemas del sistema inmunolgico -infeccin por el virus de la inmunodeficiencia humana (VIH) o SIDA -niveles bajos de plaquetas en la sangre -esclerosis mltiple -una Automotive engineerreaccin alrgica o inusual a las vacunas antigripales, a los huevos, protenas de pollo, al ltex, a la gentamicina, a otros medicamentos, alimentos, colorantes o conservantes -si est embarazada o buscando quedar embarazada -si est amamantando a un beb Cmo debo utilizar este medicamento? Esta vacuna se administra mediante inyeccin por va intramuscular. Lo administra un profesional de Beazer Homesla salud. Recibir una copia de informacin escrita sobre la vacuna antes de cada vacuna. Asegrese de leer este folleto cada vez cuidadosamente. Este folleto puede cambiar con frecuencia. Hable con su pediatra para informarse acerca del uso de este medicamento en nios. Puede requerir atencin especial. Sobredosis: Pngase en contacto inmediatamente con un centro toxicolgico o una sala de urgencia si usted cree que haya tomado demasiado medicamento. ATENCIN: Reynolds AmericanEste medicamento es solo para usted. No comparta este medicamento con nadie. Qu sucede si me olvido de una dosis? No se aplica en este caso. Qu puede interactuar con este medicamento? -quimioterapia o radioterapia -medicamentos que suprimen el sistema inmunolgico, tales como etanercept, anakinra, infliximab y adalimumab -medicamentos que tratan o  previenen cogulos sanguneos, como warfarina -fenitona -medicamentos esteroideos, como la prednisona o la  cortisona -teofilina -vacunas Puede ser que esta lista no menciona todas las posibles interacciones. Informe a su profesional de [employer] Homes de [employer] productos a base de hierbas, medicamentos de [place] o suplementos nutritivos que est tomando. Si usted fuma, consume bebidas alcohlicas o si utiliza drogas ilegales, indqueselo tambin a su profesional de [employer]. Algunas sustancias pueden interactuar con su medicamento. A qu debo estar atento al usar [employer]? Informe a su mdico o a Producer, television/film/video de la [employer] todos los efectos secundarios que persistan despus de [address]. Llame a su proveedor de atencin mdica si se presentan sntomas inusuales dentro de las 6 semanas posteriores a la vacunacin. Es posible que todava pueda contraer la gripe, pero la enfermedad no ser tan fuerte como normalmente. No puede contraer la gripe de esta vacuna. La vacuna antigripal no le protege contra resfros u otras enfermedades que pueden causar [place]. Debe vacunarse cada ao. Qu efectos secundarios puedo tener al [employer] este medicamento? Efectos secundarios que debe informar a su mdico o a Producer, television/film/video de la salud tan pronto como sea posible: -reacciones alrgicas como erupcin cutnea, picazn o urticarias, hinchazn de la cara, labios o lengua Efectos secundarios que, por lo general, no requieren atencin mdica (debe informarlos a su mdico o a su profesional de la salud si persisten o si son molestos): -fiebre -dolor de cabeza -molestias y dolores musculares -dolor, sensibilidad, enrojecimiento o Paramedic de la inyeccin -cansancio o debilidad Puede ser que esta lista no menciona todos los posibles efectos secundarios. Comunquese a su mdico por asesoramiento mdico [employer]. Usted puede informar los efectos secundarios a la FDA por telfono al 1-800-FDA-1088. Dnde debo guardar mi medicina? Esta vacuna se administra solamente en clnicas,  farmacias, consultorio mdico u otro consultorio de un profesional de la salud y no Teacher, early years/pre en su domicilio. ATENCIN: Este folleto es un resumen. Puede ser que no cubra toda la posible informacin. Si usted tiene preguntas acerca de esta medicina, consulte con su mdico, su farmacutico o su profesional de Radiographer, therapeutic.  2015, Elsevier/Gold Standard. (2009-11-09 15:31:40)

## 2014-02-24 LAB — HEPATITIS C ANTIBODY: HCV AB: NEGATIVE

## 2014-02-25 LAB — CYTOLOGY - PAP

## 2014-02-26 ENCOUNTER — Other Ambulatory Visit: Payer: Self-pay

## 2014-02-26 MED ORDER — ALENDRONATE SODIUM 70 MG PO TABS: 70.0000 mg | ORAL_TABLET | ORAL | Status: DC

## 2014-03-02 ENCOUNTER — Other Ambulatory Visit: Payer: Self-pay

## 2014-03-02 MED ORDER — ALENDRONATE SODIUM 70 MG PO TABS: 70.0000 mg | ORAL_TABLET | ORAL | Status: DC

## 2014-03-23 ENCOUNTER — Encounter: Payer: Self-pay | Admitting: Gynecology

## 2014-04-07 ENCOUNTER — Encounter: Payer: Self-pay | Admitting: Gynecology

## 2014-10-23 ENCOUNTER — Ambulatory Visit: Payer: Commercial Managed Care - PPO | Admitting: Gynecology

## 2014-11-13 IMAGING — CT CT ABD-PELV W/ CM
2 of 5 series · 17 of 46 positions shown, 19 images · IV contrast (Omnipaque 300)
Comparison: Abdominal pelvic CT 09/10/2008.

CLINICAL DATA: Two month history of right-sided abdominal pain with
nausea. History of pancreatitis and cholecystectomy.

EXAM:
CT ABDOMEN AND PELVIS WITH CONTRAST
TECHNIQUE: Multidetector CT imaging of the abdomen and pelvis was performed
using the standard protocol following bolus administration of
intravenous contrast.
CONTRAST:  100mL OMNIPAQUE IOHEXOL 300 MG/ML  SOLN

[Series 2: abd/ pel 5mm · axial · 0.60mm/px · z∈[-383,-8]mm · 14 of 85 slices shown, 16 images]
[im 5/85  soft-tissue]
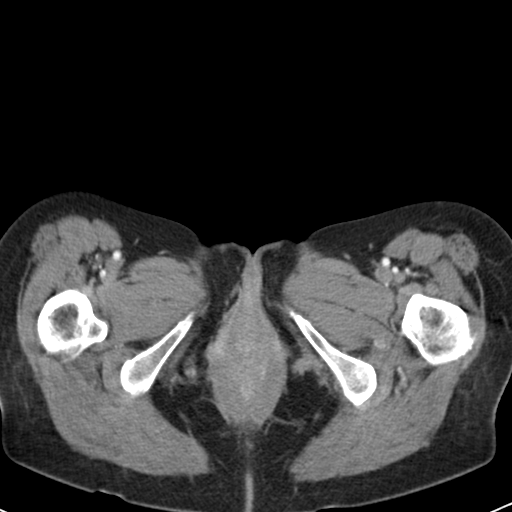
[im 5/85  bone]
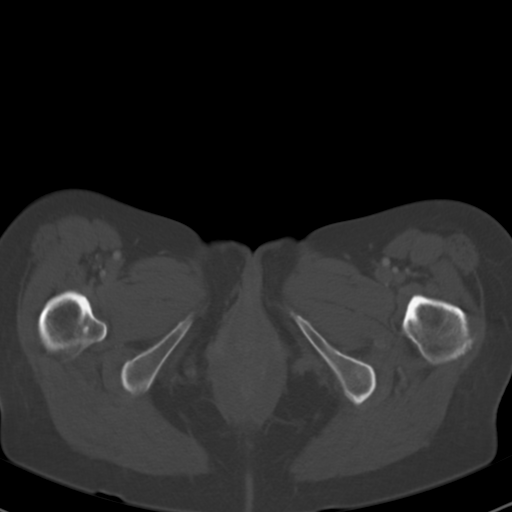
[im 13/85  soft-tissue]
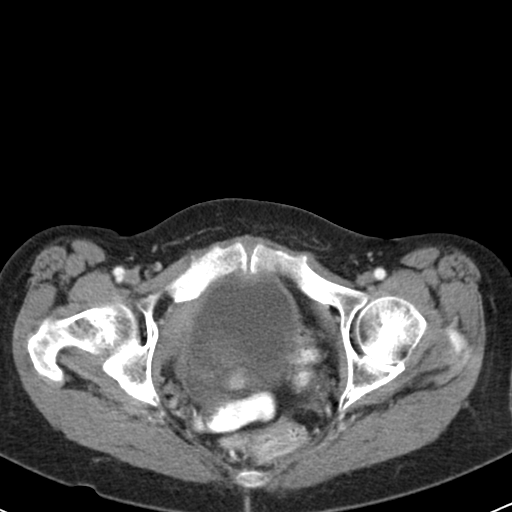
[im 17/85  soft-tissue]
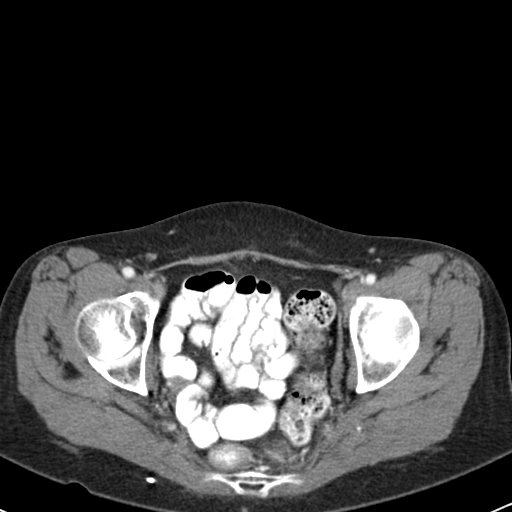
[im 22/85  soft-tissue]
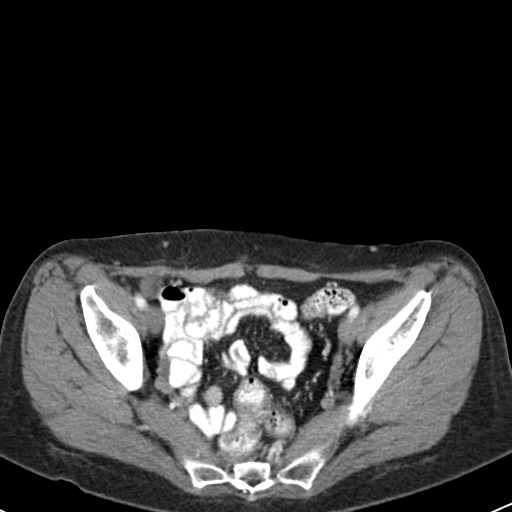
[im 30/85  soft-tissue]
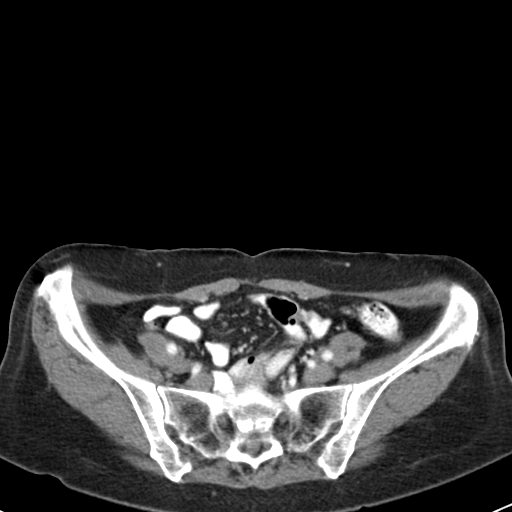
[im 34/85  soft-tissue]
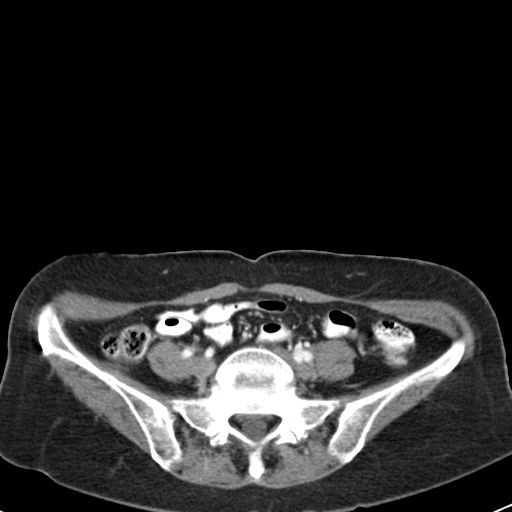
[im 38/85  soft-tissue]
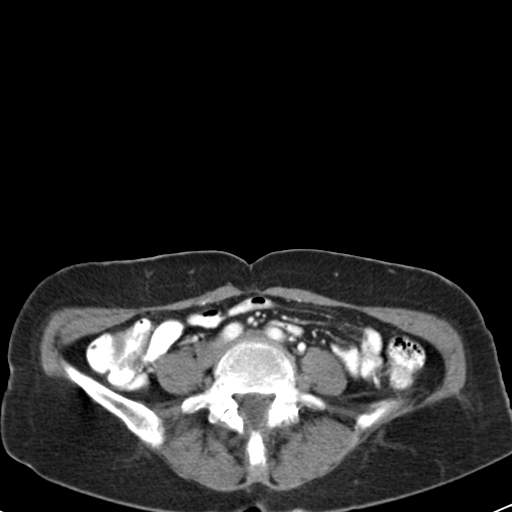
[im 47/85  soft-tissue]
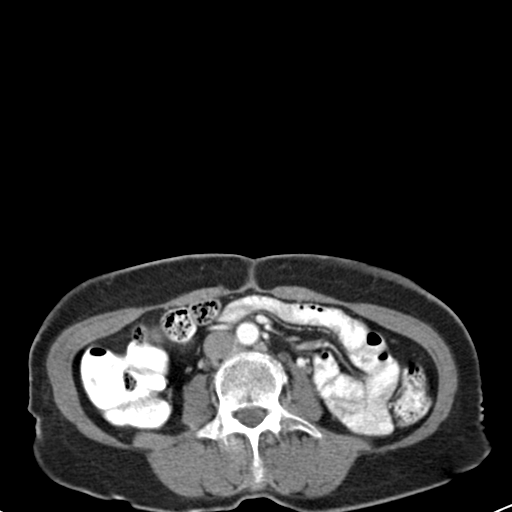
[im 51/85  soft-tissue]
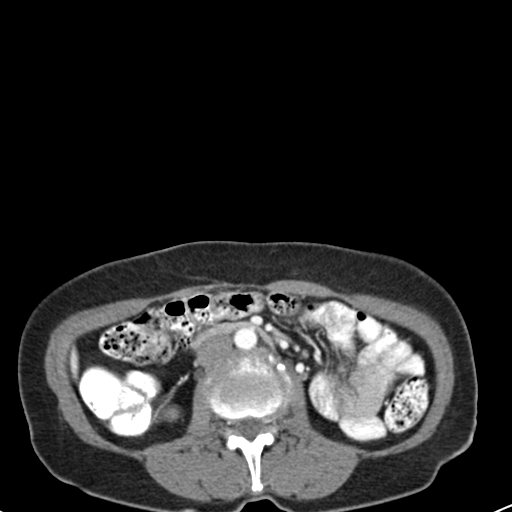
[im 51/85  bone]
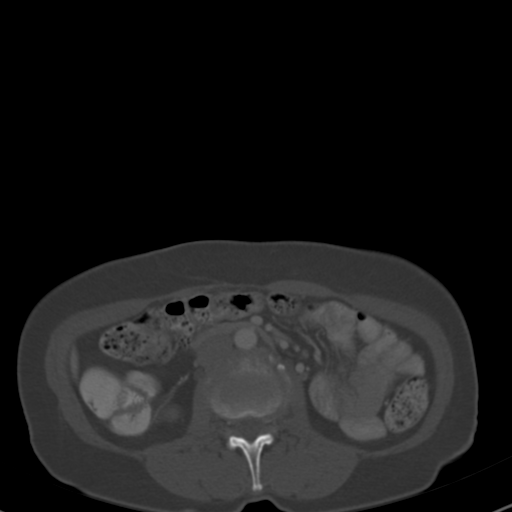
[im 55/85  soft-tissue]
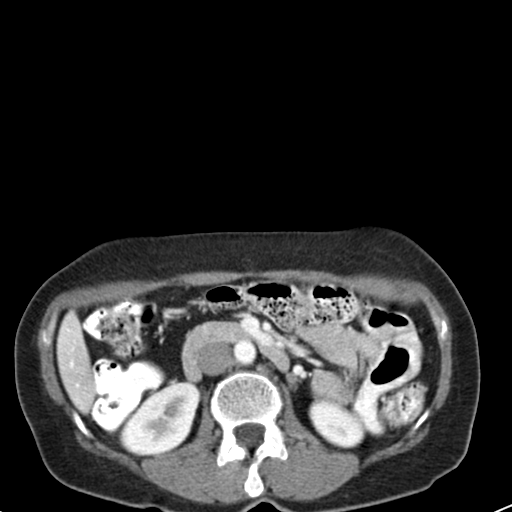
[im 64/85  soft-tissue]
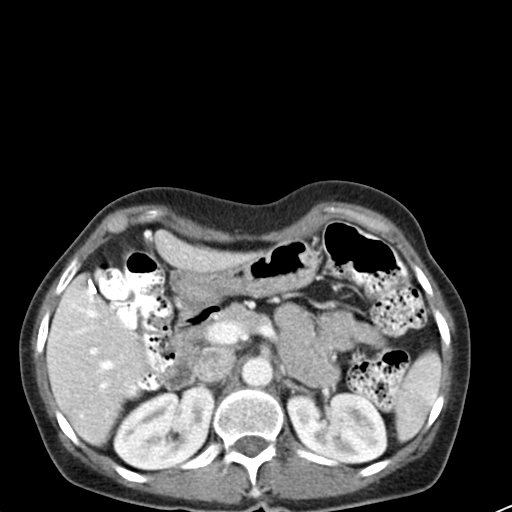
[im 68/85  soft-tissue]
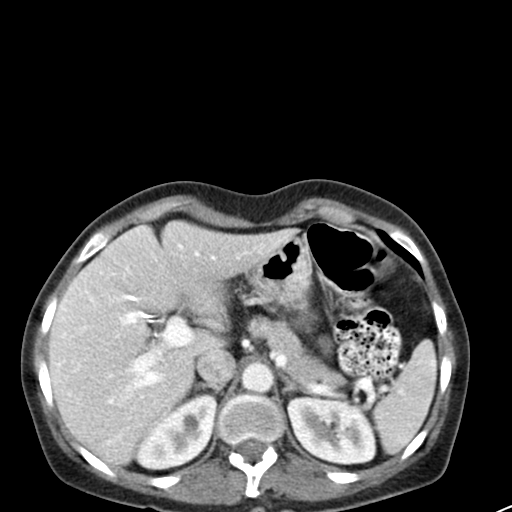
[im 72/85  soft-tissue]
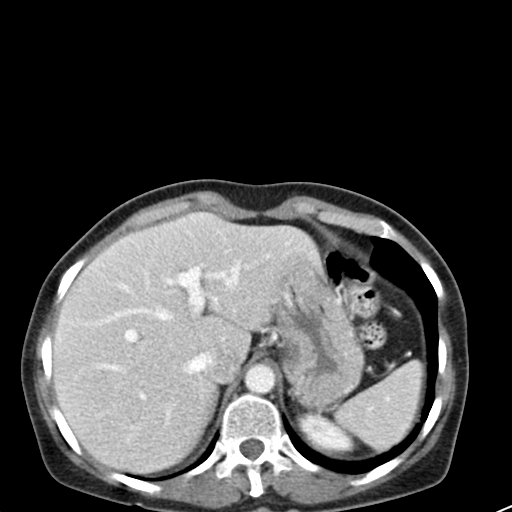
[im 80/85  soft-tissue]
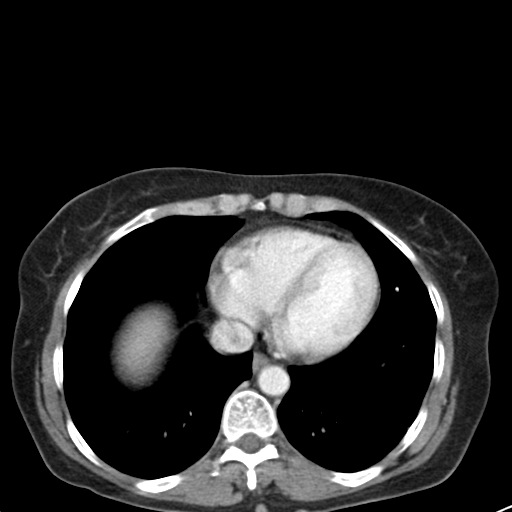

[Series 602: cor · coronal · 0.86mm/px · 3 of 85 slices shown]
[im 29/85  soft-tissue]
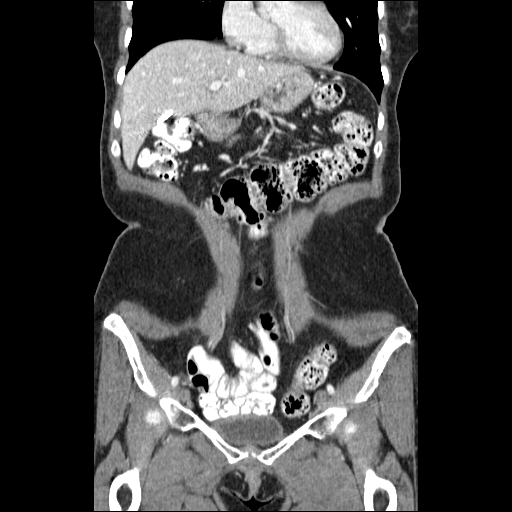
[im 38/85  soft-tissue]
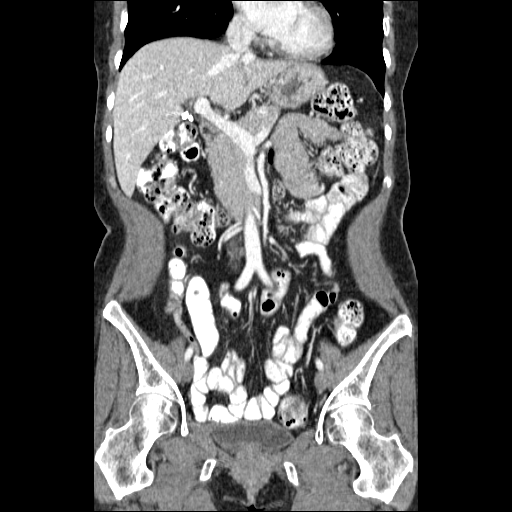
[im 47/85  soft-tissue]
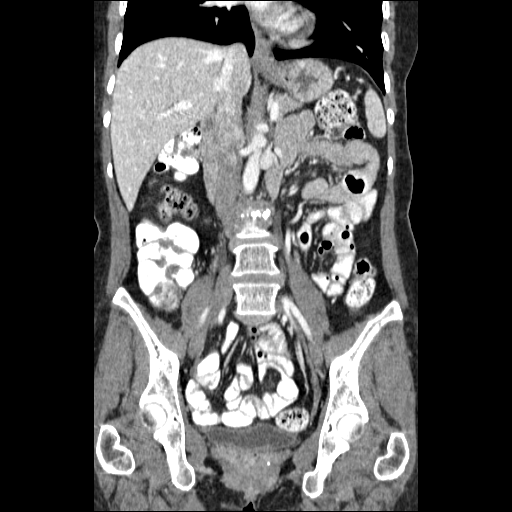

[17 of 46 positions shown; findings below may reference images not displayed]

FINDINGS: There are 2 stable calcified granulomas at the left lung base. The
lung bases are otherwise clear. There is no pleural or pericardial
effusion.

The liver, biliary system and pancreas appear normal status post
cholecystectomy. The spleen and adrenal glands appear normal. There
is a stable 8 mm cyst in the upper pole of the right kidney. The
left kidney appears normal. There is no hydronephrosis.

The stomach, small bowel, appendix and colon appear normal. There is
stool throughout the colon. No enlarged abdominal pelvic lymph nodes
are identified. There are no significant vascular findings. There is
probable residual ovarian tissue in both adnexa status post apparent
partial hysterectomy. The bladder appears normal. 1.5 cm low-density
structure medial to the right external iliac artery on image 65 is
stable. There is no discrete hernia.

There are stable bilateral L5 pars defects with a resulting grade 1
anterolisthesis and significant biforaminal stenosis at L5-S1.
IMPRESSION: 1. No acute findings or explanation for the patient's current
symptoms identified. A probable small fluid collection superiorly in
the right groin is stable.
2. The appendix and adnexa appear stable status post apparent
partial hysterectomy.
3. Bilateral L5 pars defects with grade 1 anterolisthesis and
significant biforaminal stenosis at L5-S1, unchanged.

## 2015-02-25 ENCOUNTER — Ambulatory Visit (INDEPENDENT_AMBULATORY_CARE_PROVIDER_SITE_OTHER): Payer: Commercial Managed Care - PPO | Admitting: Gynecology

## 2015-02-25 ENCOUNTER — Encounter: Payer: Self-pay | Admitting: Gynecology

## 2015-02-25 VITALS — BP 115/74 | Ht <= 58 in | Wt 98.4 lb

## 2015-02-25 DIAGNOSIS — Z79890 Hormone replacement therapy (postmenopausal): Secondary | ICD-10-CM | POA: Diagnosis not present

## 2015-02-25 DIAGNOSIS — M8580 Other specified disorders of bone density and structure, unspecified site: Secondary | ICD-10-CM

## 2015-02-25 DIAGNOSIS — R634 Abnormal weight loss: Secondary | ICD-10-CM | POA: Diagnosis not present

## 2015-02-25 DIAGNOSIS — Z01419 Encounter for gynecological examination (general) (routine) without abnormal findings: Secondary | ICD-10-CM

## 2015-02-25 DIAGNOSIS — N952 Postmenopausal atrophic vaginitis: Secondary | ICD-10-CM

## 2015-02-25 MED ORDER — NONFORMULARY OR COMPOUNDED ITEM: Status: AC

## 2015-02-25 NOTE — Patient Instructions (Signed)
Vacuna contra la culebrilla, Lo que debe saber  (Shingles Vaccine: What You Need to Know)  ¿QUÉ ES LA CULEBRILLA?  · Es una erupción dolorosa de la piel, en la que aparecen ampollas. Esta enfermedad también se denomina herpes zoster o zoster.  · Se trata de una erupción que aparece en un lado del rostro o del cuerpo y dura entre 2 y 4 semanas. El síntoma principal es el dolor, que puede ser bastante intenso. Otros síntomas son fiebre, cefalea, escalofríos y malestar en el estómago. En casos raros, esta infección puede causar neumonía, problemas auditivos, ceguera, inflamación cerebral (encefalitis) o la muerte.  · En 1 de cada 5 personas, el dolor intenso puede persistir aún después que la erupción desaparezca. Este dolor se denomina neurlagia post herpética.  · La causa de la culebrilla es el virus varicela-zoster. Este es el mismo virus que causa la varicela. Sólo las personas que han sufrido varicela o que han recibido la vacuna contra la varicela pueden tener culebrilla. El virus permanece en el organismo. Puede volver a aparecer muchos años después para causar culebrilla.  · La culebrilla no se contagia. Sin embargo, una persona que nunca sufrió varicela (ni recibió la vacuna) podría contraer varicela contagiándose de alguien que padece culebrilla. Esto no es frecuente.  · La culebrilla es más frecuente entre las personas mayores de 50 años que entre los más jóvenes. También es más frecuente en personas cuyo sistema inmunológico está debilitado debido a una enfermedad como el cáncer o medicamentos como los corticoides o las drogas utilizadas en quimioterapia.  · Al menos 1 millón de personas contrae culebrilla cada año en los [place].  VACUNA CONTRA LA CULEBRILLA  · Esta vacuna fue patentada en 2006. En los ensayos clínicos, se demostró que la vacuna prevenía la culebrilla en alrededor de la mitad de las personas. También reduce el dolor asociado a este trastorno.  · Se indica una única dosis de vacuna  en adultos de más de 60 años.  ALGUNAS PERSONAS NO DEBEN RECIBIR LA VACUNA O DEBEN ESPERAR  No deben vacunarse las personas que:  · Alguna vez hayan sufrido una reacción alérgica a la gelatina, al antibiótico neomicina o a cualquier otro componente de la vacuna. Si observa una reacción alérgica grave, comuníquese con el médico.  · Su sistema inmunológico está debilitado debido a:    SIDA u otra enfermedad que afecte el sistema inmunológico.    Tratamientos con drogas que afecten el sistema inmunológico como los corticoides.    Tratamientos para el cáncer como la radiación o la quimioterapia.    Tienen una historia de cáncer que haya afectado la médula ósea o el sistema linfático, como leucemia o linfoma.    Están embarazadas o desean estarlo. Las que deseen quedar embarazadas no deben hacerlo hasta al menos 4 semanas luego de recibir esta vacuna.  Las personas con enfermedades menores como un resfrío, pueden vacunarse. Las personas que sufren enfermedades moderadas o graves deben esperar hasta su recuperación antes de recibir la vacuna. Aquí se incluye a quienes presenten una temperatura de 101.3° F (38.5° C).  ¿CUÁLES SON LOS RIESGOS DE LA VACUNA CONTRA LA CULEBRILLA?  · Una vacuna, como cualquier medicamento, puede causar problemas serios, como por ejemplo reacciones alérgicas graves. Sin embargo, el riesgo de que cualquier vacuna cause un daño grave, o la muerte, es extremadamente pequeño.  · No se han asociado reacciones de importancia a estas vacunas.  Problemas leves  · Enrojecimiento, dolor, hinchazón, o picazón en   el lugar de la inyección (en 1 de cada 3 personas).  · Cefalea (en aproximadamente 1 de cada 70 personas).  Como todas las vacunas, esta vacuna sigue siendo controlada para hallar problemas infrecuentes o graves.  ¿QUÉ DEBO HACER EN CASO DE UNA REACCIÓN MODERADA O GRAVE?  ¿Qué debo observar?  Cualquier estado anormal como una reacción alérgica grave o fiebre alta. Si ocurre una reacción alérgica  grave, ocurrirá entre unos pocos minutos hasta una hora después de la aplicación de la inyección. Signos de reacción alérgica grave que presente dificultad para respirar, debilidad, ronquera o silbidos, ritmo cardíaco acelerado, ronchas, mareos, palidez, o hinchazón de la garganta.   ¿Qué debo hacer?  · Comuníquese con el profesional que lo asiste o lleve inmediatamente a la persona al médico.  · Cuéntele al médico lo que ha sucedido, la fecha y momento en el que ocurrió y cuándo se aplicó la vacuna.  · Pídale a su médico, enfermera o el departamento de salud que informen la reacción llenando el formulario de Vaccine Adverse Event Report (Informe de reacciones adversas a las vacunas - VAERS). O, puede enviar este informe a través de la página web del VAERS al http://www.vaers.hhs.gov o llamando al 1-800-822-7967.  El VAERS no proporciona orientación médica.  ¿CÓMO PUEDO SABER MÁS?  · El profesional podrá darle el prospecto de la vacuna o sugerirle otras fuentes de información.  · Comuníquese con los Centers for Disease Control and Prevention (Centros para el control y la prevención de enfermedades, CDC).    Llame al 1-800-232-4636 (1-800-CDC-INFO).    Visite los sitios web de CDC ubicados enhttp://www.cdc.gov/vaccines.  CDC Shingles Vaccine-Spanish VIS (02/25/08)     Esta información no tiene como fin reemplazar el consejo del médico. Asegúrese de hacerle al médico cualquier pregunta que tenga.     Document Released: 10/25/2007 Document Revised: 09/22/2014  Elsevier Interactive Patient Education ©2016 Elsevier Inc.

## 2015-02-25 NOTE — Progress Notes (Signed)
Courtney Dillon 02-Aug-1955 [id number]   History:    59 y.o.  for annual gyn exam with no complaints today. Patient has done well with vaginal estrogen twice a week. Review of her record indicated the following: Patient was seen in the office in October 2014 for consultation as a result of her bone density study. Her PCP in [place], [place] in 2014 started her on Fosamax 70 mg weekly. Patient had come to the office for a second opinion for me to look over her bone density report. She is currently taking vitamin D 2000 units daily and taking times occasionally.  Review of her bone density study done at [facility] whereby they compare 2004 weight 2014 and a documented that there was significant change in her bone density whereby the AP spine had a statistically significant decrease of -13% with a score -1.8. Her right femoral neck T. Score was -2.3 with a statistically significant decrease of -15% from previous study. Interestingly they did do a FRAX study but values were sub- threshold.Patient has informing last month she had her bone density study and was reported to be stable. We have no documentation from [facility]. Patient is currently weighing 98 pounds was weighing 102 pounds last year her PCP is doing her blood work and checking on her this coming Saturday. Patient is doing well with a Fosamax no side effects reported. Patient had a normal colonoscopy in 2010. Her PCP is Dr. Dierdre Searles in [place], [place].  Patient with prior history transvaginal hysterectomy for uterine prolapse in her late 30s and a few years later she had a retropubic sling for stress urinary incontinence.   Past medical history,surgical history, family history and social history were all reviewed and documented in the EPIC chart.  Gynecologic History Patient's last menstrual period was 03/30/1990. Contraception: status post hysterectomy Last Pap: 2015. Results were: normal Last mammogram: 2015.  Results were: normal  Obstetric History OB History  Gravida Para Term Preterm AB SAB TAB Ectopic Multiple Living  # Outcome Date GA Lbr Len/2nd Weight Sex Delivery Anes PTL Lv  8 Term     M Vag-Spont  N Y  7 Term     F Vag-Spont  N Y  6 Term     F Vag-Spont   Y  5 Term     F Vag-Spont   Y  4 Term     F Vag-Spont   Y  3 Term     F Vag-Spont   Y  2 Term     F CS-Unspec  N Y  1 Term     F CS-Unspec   Y       ROS: A ROS was performed and pertinent positives and negatives are included in the history.  GENERAL: No fevers or chills. HEENT: No change in vision, no earache, sore throat or sinus congestion. NECK: No pain or stiffness. CARDIOVASCULAR: No chest pain or pressure. No palpitations. PULMONARY: No shortness of breath, cough or wheeze. GASTROINTESTINAL: No abdominal pain, nausea, vomiting or diarrhea, melena or bright red blood per rectum. GENITOURINARY: No urinary frequency, urgency, hesitancy or dysuria. MUSCULOSKELETAL: No joint or muscle pain, no back pain, no recent trauma. DERMATOLOGIC: No rash, no itching, no lesions. ENDOCRINE: No polyuria, polydipsia, no heat or cold intolerance. No recent change in weight. HEMATOLOGICAL: No anemia or easy bruising or bleeding. NEUROLOGIC: No headache, seizures, numbness, tingling or weakness.  PSYCHIATRIC: No depression, no loss of interest in normal activity or change in sleep pattern.     Exam: chaperone present  BP 115/74 mmHg  Ht  (1.473 m)  Wt 98 lb 6.4 oz (44.634 kg)  BMI 20.57 kg/m2  LMP 03/30/1990  Body mass index is 20.57 kg/(m^2).  General appearance : Well developed well nourished female. No acute distress HEENT: Eyes: no retinal hemorrhage or exudates,  Neck supple, trachea midline, no carotid bruits, no thyroidmegaly Lungs: Clear to auscultation, no rhonchi or wheezes, or rib retractions  Heart: Regular rate and rhythm, no murmurs or gallops Breast:Examined in sitting and supine position were  symmetrical in appearance, no palpable masses or tenderness,  no skin retraction, no nipple inversion, no nipple discharge, no skin discoloration, no axillary or supraclavicular lymphadenopathy Abdomen: no palpable masses or tenderness, no rebound or guarding Extremities: no edema or skin discoloration or tenderness  Pelvic:  Bartholin, Urethra, Skene Glands: Within normal limits             Vagina: No gross lesions or discharge, atrophic changes  Cervix: Absent  Uterus  absent  Adnexa  Without masses or tenderness  Anus and perineum  normal   Rectovaginal  normal sphincter tone without palpated masses or tenderness             Hemoccult cards will be provided     Assessment/Plan:  59 y.o. female for annual exam doing well on vaginal estrogen twice a week. PCP doing workup on patient's low weight. No blood work we drawn today. Pap smear no longer indicated. Prescription to obtain shingles vaccine provided. We will get the patient to sign a medical release so that we can obtain a bone density report from last month. Patient was provided with fecal Hemoccult cards to submit to the office for testing. Patient was reminded to schedule her mammogram. We discussed importance of calcium and vitamin D and regular exercise for osteoporosis prevention. We discussed keeping her on Fosamax for a total of no more than 7 years before going on a drug holiday. She is currently on year or 2. Ok Edwards MD, 4:33 PM 02/25/2015

## 2015-03-22 ENCOUNTER — Encounter: Payer: Self-pay | Admitting: Gynecology

## 2015-04-15 ENCOUNTER — Other Ambulatory Visit: Payer: Self-pay | Admitting: Gynecology

## 2016-02-21 ENCOUNTER — Telehealth: Payer: Self-pay | Admitting: Internal Medicine

## 2016-02-21 NOTE — Telephone Encounter (Signed)
Spoke with Belenda CruiseKristin. Pt had feeding tube placed while she was in the hospital. State pt now needs the tube removed. Discussed with Belenda CruiseKristin that they can refer the pt to Interventional Radiology to have this removed.

## 2016-03-20 ENCOUNTER — Other Ambulatory Visit: Payer: Self-pay | Admitting: Gynecology

## 2016-06-12 ENCOUNTER — Other Ambulatory Visit: Payer: Self-pay | Admitting: Gynecology

## 2016-10-04 ENCOUNTER — Encounter: Payer: Self-pay | Admitting: Gynecology

## 2018-09-04 ENCOUNTER — Encounter: Payer: Self-pay | Admitting: Gastroenterology

## 2019-02-18 ENCOUNTER — Encounter: Payer: Self-pay | Admitting: Gynecology
